# Patient Record
Sex: Female | Born: 1950 | Race: White | Hispanic: No | Marital: Married | Smoking: Former smoker
Health system: Southern US, Community
[De-identification: ages and names within clinical notes are randomized; demographics above are authoritative.]

## PROBLEM LIST (undated history)

## (undated) DIAGNOSIS — M1990 Unspecified osteoarthritis, unspecified site: Secondary | ICD-10-CM

## (undated) DIAGNOSIS — I10 Essential (primary) hypertension: Secondary | ICD-10-CM

## (undated) DIAGNOSIS — I341 Nonrheumatic mitral (valve) prolapse: Secondary | ICD-10-CM

## (undated) DIAGNOSIS — N189 Chronic kidney disease, unspecified: Secondary | ICD-10-CM

---

## 1999-07-23 HISTORY — PX: ROTATOR CUFF REPAIR: SHX139

## 2004-08-03 ENCOUNTER — Ambulatory Visit: Payer: Self-pay | Admitting: Unknown Physician Specialty

## 2005-10-18 ENCOUNTER — Ambulatory Visit: Payer: Self-pay | Admitting: Unknown Physician Specialty

## 2006-09-03 ENCOUNTER — Ambulatory Visit: Payer: Self-pay | Admitting: Unknown Physician Specialty

## 2006-11-12 ENCOUNTER — Ambulatory Visit: Payer: Self-pay | Admitting: Unknown Physician Specialty

## 2007-12-15 ENCOUNTER — Encounter: Admission: RE | Admit: 2007-12-15 | Discharge: 2007-12-15 | Payer: Self-pay | Admitting: Unknown Physician Specialty

## 2009-01-02 ENCOUNTER — Ambulatory Visit: Payer: Self-pay | Admitting: Unknown Physician Specialty

## 2010-01-15 ENCOUNTER — Ambulatory Visit: Payer: Self-pay | Admitting: Unknown Physician Specialty

## 2011-01-21 ENCOUNTER — Ambulatory Visit: Payer: Self-pay | Admitting: Unknown Physician Specialty

## 2011-10-16 ENCOUNTER — Ambulatory Visit: Payer: Self-pay | Admitting: Unknown Physician Specialty

## 2011-10-21 LAB — PATHOLOGY REPORT

## 2012-01-31 ENCOUNTER — Ambulatory Visit: Payer: Self-pay | Admitting: Internal Medicine

## 2013-02-08 ENCOUNTER — Ambulatory Visit: Payer: Self-pay | Admitting: Physician Assistant

## 2014-02-09 ENCOUNTER — Ambulatory Visit: Payer: Self-pay | Admitting: Physician Assistant

## 2015-01-25 ENCOUNTER — Other Ambulatory Visit: Payer: Self-pay | Admitting: Physician Assistant

## 2015-01-25 DIAGNOSIS — Z1231 Encounter for screening mammogram for malignant neoplasm of breast: Secondary | ICD-10-CM

## 2015-02-13 ENCOUNTER — Ambulatory Visit: Payer: Self-pay

## 2015-02-14 ENCOUNTER — Ambulatory Visit: Payer: Self-pay

## 2015-02-17 ENCOUNTER — Ambulatory Visit
Admission: RE | Admit: 2015-02-17 | Discharge: 2015-02-17 | Disposition: A | Discharge status: Home / Self Care | Payer: Medicare Other | Source: Ambulatory Visit | Attending: Physician Assistant | Admitting: Physician Assistant

## 2015-02-17 ENCOUNTER — Other Ambulatory Visit: Payer: Self-pay | Admitting: Physician Assistant

## 2015-02-17 DIAGNOSIS — Z1231 Encounter for screening mammogram for malignant neoplasm of breast: Secondary | ICD-10-CM

## 2015-10-17 ENCOUNTER — Other Ambulatory Visit: Payer: Self-pay | Admitting: Physician Assistant

## 2015-10-17 DIAGNOSIS — Z1231 Encounter for screening mammogram for malignant neoplasm of breast: Secondary | ICD-10-CM

## 2016-02-19 ENCOUNTER — Ambulatory Visit
Admission: RE | Admit: 2016-02-19 | Discharge: 2016-02-19 | Disposition: A | Discharge status: Home / Self Care | Payer: Medicare Other | Source: Ambulatory Visit | Attending: Physician Assistant | Admitting: Physician Assistant

## 2016-02-19 ENCOUNTER — Other Ambulatory Visit: Payer: Self-pay | Admitting: Physician Assistant

## 2016-02-19 DIAGNOSIS — Z1231 Encounter for screening mammogram for malignant neoplasm of breast: Secondary | ICD-10-CM | POA: Diagnosis present

## 2017-01-30 ENCOUNTER — Other Ambulatory Visit: Payer: Self-pay | Admitting: Physician Assistant

## 2017-01-30 DIAGNOSIS — Z1231 Encounter for screening mammogram for malignant neoplasm of breast: Secondary | ICD-10-CM

## 2017-02-24 ENCOUNTER — Ambulatory Visit
Admission: RE | Admit: 2017-02-24 | Discharge: 2017-02-24 | Disposition: A | Discharge status: Home / Self Care | Payer: Medicare Other | Source: Ambulatory Visit | Attending: Physician Assistant | Admitting: Physician Assistant

## 2017-02-24 DIAGNOSIS — Z1231 Encounter for screening mammogram for malignant neoplasm of breast: Secondary | ICD-10-CM | POA: Diagnosis not present

## 2017-03-11 ENCOUNTER — Encounter: Payer: Self-pay | Admitting: *Deleted

## 2017-03-12 ENCOUNTER — Ambulatory Visit
Admission: RE | Admit: 2017-03-12 | Discharge: 2017-03-12 | Disposition: A | Discharge status: Home / Self Care | Payer: Medicare Other | Source: Ambulatory Visit | Attending: Unknown Physician Specialty | Admitting: Unknown Physician Specialty

## 2017-03-12 ENCOUNTER — Ambulatory Visit: Payer: Medicare Other | Admitting: Anesthesiology

## 2017-03-12 ENCOUNTER — Encounter
Admission: RE | Disposition: A | Discharge status: Home / Self Care | Payer: Self-pay | Source: Ambulatory Visit | Attending: Unknown Physician Specialty

## 2017-03-12 ENCOUNTER — Encounter: Payer: Self-pay | Admitting: Anesthesiology

## 2017-03-12 DIAGNOSIS — I129 Hypertensive chronic kidney disease with stage 1 through stage 4 chronic kidney disease, or unspecified chronic kidney disease: Secondary | ICD-10-CM | POA: Insufficient documentation

## 2017-03-12 DIAGNOSIS — Z7982 Long term (current) use of aspirin: Secondary | ICD-10-CM | POA: Diagnosis not present

## 2017-03-12 DIAGNOSIS — I341 Nonrheumatic mitral (valve) prolapse: Secondary | ICD-10-CM | POA: Diagnosis not present

## 2017-03-12 DIAGNOSIS — Z881 Allergy status to other antibiotic agents status: Secondary | ICD-10-CM | POA: Insufficient documentation

## 2017-03-12 DIAGNOSIS — Z1211 Encounter for screening for malignant neoplasm of colon: Secondary | ICD-10-CM | POA: Insufficient documentation

## 2017-03-12 DIAGNOSIS — G4730 Sleep apnea, unspecified: Secondary | ICD-10-CM | POA: Insufficient documentation

## 2017-03-12 DIAGNOSIS — Z79899 Other long term (current) drug therapy: Secondary | ICD-10-CM | POA: Diagnosis not present

## 2017-03-12 DIAGNOSIS — K640 First degree hemorrhoids: Secondary | ICD-10-CM | POA: Insufficient documentation

## 2017-03-12 DIAGNOSIS — Z87891 Personal history of nicotine dependence: Secondary | ICD-10-CM | POA: Diagnosis not present

## 2017-03-12 DIAGNOSIS — N189 Chronic kidney disease, unspecified: Secondary | ICD-10-CM | POA: Diagnosis not present

## 2017-03-12 DIAGNOSIS — J449 Chronic obstructive pulmonary disease, unspecified: Secondary | ICD-10-CM | POA: Insufficient documentation

## 2017-03-12 DIAGNOSIS — M1990 Unspecified osteoarthritis, unspecified site: Secondary | ICD-10-CM | POA: Insufficient documentation

## 2017-03-12 DIAGNOSIS — D124 Benign neoplasm of descending colon: Secondary | ICD-10-CM | POA: Insufficient documentation

## 2017-03-12 HISTORY — PX: COLONOSCOPY WITH PROPOFOL: SHX5780

## 2017-03-12 HISTORY — DX: Chronic kidney disease, unspecified: N18.9

## 2017-03-12 HISTORY — DX: Unspecified osteoarthritis, unspecified site: M19.90

## 2017-03-12 HISTORY — DX: Essential (primary) hypertension: I10

## 2017-03-12 HISTORY — DX: Nonrheumatic mitral (valve) prolapse: I34.1

## 2017-03-12 SURGERY — COLONOSCOPY WITH PROPOFOL
Anesthesia: General

## 2017-03-12 MED ORDER — SODIUM CHLORIDE 0.9 % IV SOLN: INTRAVENOUS | Status: DC

## 2017-03-12 MED ORDER — PROPOFOL 500 MG/50ML IV EMUL
INTRAVENOUS | Status: AC
  Filled 2017-03-12: qty 50

## 2017-03-12 MED ORDER — FENTANYL CITRATE (PF) 100 MCG/2ML IJ SOLN
INTRAMUSCULAR | Status: DC | PRN
  Administered 2017-03-12 (×2): 50 ug via INTRAVENOUS

## 2017-03-12 MED ORDER — SODIUM CHLORIDE 0.9 % IV SOLN
INTRAVENOUS | Status: DC
  Administered 2017-03-12: 11:00:00 via INTRAVENOUS

## 2017-03-12 MED ORDER — EPHEDRINE SULFATE 50 MG/ML IJ SOLN
INTRAMUSCULAR | Status: DC | PRN
  Administered 2017-03-12: 10 mg via INTRAVENOUS

## 2017-03-12 MED ORDER — MIDAZOLAM HCL 2 MG/2ML IJ SOLN
INTRAMUSCULAR | Status: DC | PRN
  Administered 2017-03-12: 2 mg via INTRAVENOUS

## 2017-03-12 MED ORDER — EPHEDRINE SULFATE 50 MG/ML IJ SOLN
INTRAMUSCULAR | Status: AC
  Filled 2017-03-12: qty 1

## 2017-03-12 MED ORDER — PROPOFOL 500 MG/50ML IV EMUL
INTRAVENOUS | Status: DC | PRN
  Administered 2017-03-12: 120 ug/kg/min via INTRAVENOUS

## 2017-03-12 MED ORDER — MIDAZOLAM HCL 2 MG/2ML IJ SOLN
INTRAMUSCULAR | Status: AC
  Filled 2017-03-12: qty 2

## 2017-03-12 MED ORDER — FENTANYL CITRATE (PF) 100 MCG/2ML IJ SOLN
INTRAMUSCULAR | Status: AC
  Filled 2017-03-12: qty 2

## 2017-03-12 NOTE — H&P (Signed)
Primary Care Physician:  Marinda Elk, MD Primary Gastroenterologist:  Dr. Vira Agar  Pre-Procedure History & Physical: HPI:  Annette Bell is a 66 y.o. female is here for an colonoscopy.   Past Medical History:  Diagnosis Date  . Arthritis   . Chronic kidney disease   . Hypertension   . Mitral valve prolapse     Past Surgical History:  Procedure Laterality Date  . ROTATOR CUFF REPAIR Left 2001    Prior to Admission medications   Medication Sig Start Date End Date Taking? Authorizing Provider  aspirin EC 81 MG tablet Take 81 mg by mouth daily.   Yes [provider]  atorvastatin (LIPITOR) 10 MG tablet Take 10 mg by mouth daily.   Yes [provider]  CALCIUM CARBONATE-VIT D-MIN PO Take 1 tablet by mouth daily.   Yes [provider]  co-enzyme Q-10 30 MG capsule Take 30 mg by mouth daily.   Yes [provider]  diazepam (VALIUM) 5 MG tablet Take 5 mg by mouth daily.   Yes [provider]  fexofenadine-pseudoephedrine (ALLEGRA-D 24) 180-240 MG 24 hr tablet Take 1 tablet by mouth daily.   Yes [provider]  irbesartan (AVAPRO) 150 MG tablet Take 150 mg by mouth daily.   Yes [provider]  Multiple Vitamin (MULTIVITAMIN) tablet Take 1 tablet by mouth daily.   Yes [provider]  omega-3 acid ethyl esters (LOVAZA) 1 g capsule Take 1 g by mouth daily.   Yes [provider]  triamterene-hydrochlorothiazide (MAXZIDE-25) 37.5-25 MG tablet Take 1 tablet by mouth daily.   Yes [provider]  cetirizine (ZYRTEC) 10 MG tablet Take 10 mg by mouth daily.    [provider]    Allergies as of 12/13/2016 - never reviewed  Allergen Reaction Noted  . Sulfa antibiotics Other (See Comments) 02/17/2015    Family History  Problem Relation Age of Onset  . Breast cancer Neg Hx     Social History   Social History  . Marital status: Married    Spouse name: N/A  . Number of  children: N/A  . Years of education: N/A   Occupational History  . Not on file.   Social History Main Topics  . Smoking status: Former Research scientist (life sciences)  . Smokeless tobacco: Never Used  . Alcohol use Yes  . Drug use: No  . Sexual activity: Not on file   Other Topics Concern  . Not on file   Social History Narrative  . No narrative on file    Review of Systems: See HPI, otherwise negative ROS  Physical Exam: BP (!) 132/56   Pulse 81   Temp 99.6 F (37.6 C) (Tympanic)   Resp 16   Ht 5\' 7"  (1.702 m)   Wt 76.2 kg (168 lb)   SpO2 100%   BMI 26.31 kg/m  General:   Alert,  pleasant and cooperative in NAD Head:  Normocephalic and atraumatic. Neck:  Supple; no masses or thyromegaly. Lungs:  Clear throughout to auscultation.    Heart:  Regular rate and rhythm. Abdomen:  Soft, nontender and nondistended. Normal bowel sounds, without guarding, and without rebound.   Neurologic:  Alert and  oriented x4;  grossly normal neurologically.  Impression/Plan: Annette Bell is here for an colonoscopy to be performed for [facility] colon polyps  Risks, benefits, limitations, and alternatives regarding  colonoscopy have been reviewed with the patient.  Questions have been answered.  All parties agreeable.  Gaylyn Cheers, MD  03/12/2017, 11:19 AM

## 2017-03-12 NOTE — Transfer of Care (Signed)
Immediate Anesthesia Transfer of Care Note  Patient: Annette Bell  Procedure(s) Performed: Procedure(s): COLONOSCOPY WITH PROPOFOL (N/A)  Patient Location: PACU  Anesthesia Type:General  Level of Consciousness: awake and sedated  Airway & Oxygen Therapy: Patient Spontanous Breathing and Patient connected to nasal cannula oxygen  Post-op Assessment: Report given to RN and Post -op Vital signs reviewed and stable  Post vital signs: Reviewed  Last Vitals:  Vitals:   03/12/17 1104  BP: (!) 132/56  Pulse: 81  Resp: 16  Temp: 37.6 C  SpO2: 100%    Last Pain:  Vitals:   03/12/17 1104  TempSrc: Tympanic         Complications: No apparent anesthesia complications

## 2017-03-12 NOTE — Anesthesia Post-op Follow-up Note (Signed)
Anesthesia QCDR form completed.        

## 2017-03-12 NOTE — Anesthesia Procedure Notes (Signed)
Performed by: Vaughan Sine Pre-anesthesia Checklist: Patient identified, Suction available, Emergency Drugs available, Patient being monitored and Timeout performed Patient Re-evaluated:Patient Re-evaluated prior to induction Oxygen Delivery Method: Nasal cannula Preoxygenation: Pre-oxygenation with 100% oxygen Induction Type: IV induction Placement Confirmation: positive ETCO2 and CO2 detector

## 2017-03-12 NOTE — Anesthesia Postprocedure Evaluation (Signed)
Anesthesia Post Note  Patient: Annette Bell  Procedure(s) Performed: Procedure(s) (LRB): COLONOSCOPY WITH PROPOFOL (N/A)  Patient location during evaluation: Endoscopy Anesthesia Type: General Level of consciousness: awake and alert and oriented Pain management: pain level controlled Vital Signs Assessment: post-procedure vital signs reviewed and stable Respiratory status: spontaneous breathing, nonlabored ventilation and respiratory function stable Cardiovascular status: blood pressure returned to baseline and stable Postop Assessment: no signs of nausea or vomiting Anesthetic complications: no     Last Vitals:  Vitals:   03/12/17 1157 03/12/17 1207  BP: (!) 107/46 (!) 112/55  Pulse: 68 82  Resp: 16 16  Temp: (!) 35.6 C   SpO2: 99% 100%    Last Pain:  Vitals:   03/12/17 1157  TempSrc: Tympanic                 Mehtab Dolberry

## 2017-03-12 NOTE — Op Note (Signed)
[facility] Gastroenterology Patient Name: Annette Bell Procedure Date: 03/12/2017 11:23 AM MRN: [id number] Account #: [id number] Date of Birth: [birth year] Admit Type: Outpatient Age: 66 Room: [facility] ENDO ROOM 3 Gender: Female Note Status: Finalized Procedure:            Colonoscopy Indications:          High risk colon cancer surveillance: Personal history                        of colonic polyps Providers:            Manya Silvas, MD Referring MD:         Precious Bard, MD (Referring MD) Medicines:            Propofol per Anesthesia Complications:        No immediate complications. Procedure:            Pre-Anesthesia Assessment:                       - After reviewing the risks and benefits, the patient                        was deemed in satisfactory condition to undergo the                        procedure.                       After obtaining informed consent, the colonoscope was                        passed under direct vision. Throughout the procedure,                        the patient's blood pressure, pulse, and oxygen                        saturations were monitored continuously. The                        Colonoscope was introduced through the anus and                        advanced to the the cecum, identified by appendiceal                        orifice and ileocecal valve. The colonoscopy was                        performed without difficulty. The patient tolerated the                        procedure well. The quality of the bowel preparation                        was excellent. Findings:      Two sessile polyps were found in the transverse colon. The polyps were       diminutive in size. These polyps were removed with a jumbo cold forceps.       Resection and retrieval were complete.      A small  polyp was found in the descending colon. The polyp was sessile.       The polyp was removed with a hot snare. Resection and  retrieval were       complete.      The exam was otherwise without abnormality.      Internal hemorrhoids were found during endoscopy. The hemorrhoids were       small and Grade I (internal hemorrhoids that do not prolapse).      The exam was otherwise without abnormality. Impression:           - Two diminutive polyps in the transverse colon,                        removed with a jumbo cold forceps. Resected and                        retrieved.                       - One small polyp in the descending colon, removed with                        a hot snare. Resected and retrieved.                       - The examination was otherwise normal. Recommendation:       - Await pathology results. Manya Silvas, MD 03/12/2017 11:56:10 AM This report has been signed electronically. Number of Addenda: 0 Note Initiated On: 03/12/2017 11:23 AM Scope Withdrawal Time: 0 hours 13 minutes 57 seconds  Total Procedure Duration: 0 hours 21 minutes 41 seconds       [facility]

## 2017-03-12 NOTE — Anesthesia Preprocedure Evaluation (Signed)
Anesthesia Evaluation  Patient identified by MRN, date of birth, ID band Patient awake    Reviewed: Allergy & Precautions, NPO status , Patient's Chart, lab work & pertinent test results  History of Anesthesia Complications Negative for: history of anesthetic complications  Airway Mallampati: III  TM Distance: >3 FB Neck ROM: Full    Dental no notable dental hx.    Pulmonary neg sleep apnea, neg COPD, former smoker,    breath sounds clear to auscultation- rhonchi (-) wheezing      Cardiovascular Exercise Tolerance: Good hypertension, Pt. on medications (-) CAD, (-) Past MI and (-) Cardiac Stents  Rhythm:Regular Rate:Normal - Systolic murmurs and - Diastolic murmurs    Neuro/Psych negative neurological ROS  negative psych ROS   GI/Hepatic negative GI ROS, Neg liver ROS,   Endo/Other  negative endocrine ROSneg diabetes  Renal/GU Renal InsufficiencyRenal disease     Musculoskeletal  (+) Arthritis ,   Abdominal (+) - obese,   Peds  Hematology negative hematology ROS (+)   Anesthesia Other Findings Past Medical History: No date: Arthritis No date: Chronic kidney disease No date: Hypertension No date: Mitral valve prolapse   Reproductive/Obstetrics                             Anesthesia Physical Anesthesia Plan  ASA: II  Anesthesia Plan: General   Post-op Pain Management:    Induction: Intravenous  PONV Risk Score and Plan: 2 and Propofol infusion  Airway Management Planned: Natural Airway  Additional Equipment:   Intra-op Plan:   Post-operative Plan:   Informed Consent: I have reviewed the patients History and Physical, chart, labs and discussed the procedure including the risks, benefits and alternatives for the proposed anesthesia with the patient or authorized representative who has indicated his/her understanding and acceptance.   Dental advisory given  Plan Discussed  with: CRNA and Anesthesiologist  Anesthesia Plan Comments:         Anesthesia Quick Evaluation

## 2017-03-13 ENCOUNTER — Encounter: Payer: Self-pay | Admitting: Unknown Physician Specialty

## 2017-03-13 LAB — SURGICAL PATHOLOGY

## 2018-02-27 ENCOUNTER — Other Ambulatory Visit: Payer: Self-pay | Admitting: Physician Assistant

## 2018-03-02 ENCOUNTER — Other Ambulatory Visit: Payer: Self-pay | Admitting: Physician Assistant

## 2018-03-02 DIAGNOSIS — Z1231 Encounter for screening mammogram for malignant neoplasm of breast: Secondary | ICD-10-CM

## 2018-03-11 ENCOUNTER — Other Ambulatory Visit: Payer: Self-pay | Admitting: Sports Medicine

## 2018-03-11 DIAGNOSIS — G8929 Other chronic pain: Secondary | ICD-10-CM

## 2018-03-11 DIAGNOSIS — M25511 Pain in right shoulder: Principal | ICD-10-CM

## 2018-03-19 ENCOUNTER — Ambulatory Visit
Admission: RE | Admit: 2018-03-19 | Discharge: 2018-03-19 | Disposition: A | Discharge status: Home / Self Care | Payer: Medicare Other | Source: Ambulatory Visit | Attending: Sports Medicine | Admitting: Sports Medicine

## 2018-03-19 ENCOUNTER — Other Ambulatory Visit: Payer: Self-pay | Admitting: Sports Medicine

## 2018-03-19 ENCOUNTER — Ambulatory Visit
Admission: RE | Admit: 2018-03-19 | Discharge: 2018-03-19 | Disposition: A | Discharge status: Home / Self Care | Payer: Self-pay | Source: Ambulatory Visit | Attending: Sports Medicine | Admitting: Sports Medicine

## 2018-03-19 DIAGNOSIS — M25511 Pain in right shoulder: Principal | ICD-10-CM

## 2018-03-19 DIAGNOSIS — G8929 Other chronic pain: Secondary | ICD-10-CM

## 2018-03-19 MED ORDER — IOPAMIDOL (ISOVUE-M 200) INJECTION 41%
12.0000 mL | Freq: Once | INTRAMUSCULAR | Status: AC
  Administered 2018-03-19: 12 mL via INTRA_ARTICULAR

## 2018-03-20 ENCOUNTER — Ambulatory Visit
Admission: RE | Admit: 2018-03-20 | Discharge: 2018-03-20 | Disposition: A | Discharge status: Home / Self Care | Payer: Medicare Other | Source: Ambulatory Visit | Attending: Physician Assistant | Admitting: Physician Assistant

## 2018-03-20 DIAGNOSIS — Z1231 Encounter for screening mammogram for malignant neoplasm of breast: Secondary | ICD-10-CM | POA: Insufficient documentation

## 2018-08-26 ENCOUNTER — Other Ambulatory Visit: Payer: Self-pay | Admitting: Neurology

## 2018-08-26 DIAGNOSIS — R55 Syncope and collapse: Secondary | ICD-10-CM

## 2018-09-08 ENCOUNTER — Other Ambulatory Visit: Payer: Medicare Other

## 2019-01-04 ENCOUNTER — Other Ambulatory Visit: Payer: Self-pay | Admitting: Physician Assistant

## 2019-01-04 DIAGNOSIS — Z1231 Encounter for screening mammogram for malignant neoplasm of breast: Secondary | ICD-10-CM

## 2019-04-14 ENCOUNTER — Ambulatory Visit
Admission: RE | Admit: 2019-04-14 | Discharge: 2019-04-14 | Disposition: A | Discharge status: Home / Self Care | Payer: Medicare Other | Source: Ambulatory Visit | Attending: Physician Assistant | Admitting: Physician Assistant

## 2019-04-14 DIAGNOSIS — Z1231 Encounter for screening mammogram for malignant neoplasm of breast: Secondary | ICD-10-CM | POA: Insufficient documentation

## 2020-04-10 ENCOUNTER — Other Ambulatory Visit: Payer: Self-pay | Admitting: Physician Assistant

## 2020-04-10 DIAGNOSIS — Z1231 Encounter for screening mammogram for malignant neoplasm of breast: Secondary | ICD-10-CM

## 2020-04-11 ENCOUNTER — Other Ambulatory Visit: Payer: Self-pay | Admitting: Nephrology

## 2020-04-11 DIAGNOSIS — N1831 Chronic kidney disease, stage 3a: Secondary | ICD-10-CM

## 2020-04-19 ENCOUNTER — Ambulatory Visit: Payer: Medicare Other

## 2020-05-09 ENCOUNTER — Ambulatory Visit
Admission: RE | Admit: 2020-05-09 | Discharge: 2020-05-09 | Disposition: A | Discharge status: Home / Self Care | Payer: Medicare Other | Source: Ambulatory Visit | Attending: Physician Assistant | Admitting: Physician Assistant

## 2020-05-09 ENCOUNTER — Other Ambulatory Visit: Payer: Self-pay

## 2020-05-09 DIAGNOSIS — Z1231 Encounter for screening mammogram for malignant neoplasm of breast: Secondary | ICD-10-CM | POA: Diagnosis not present

## 2020-05-11 ENCOUNTER — Ambulatory Visit: Payer: Medicare Other

## 2020-05-12 ENCOUNTER — Ambulatory Visit: Payer: Medicare Other

## 2020-05-18 ENCOUNTER — Ambulatory Visit
Admission: RE | Admit: 2020-05-18 | Discharge: 2020-05-18 | Disposition: A | Discharge status: Home / Self Care | Payer: Medicare Other | Source: Ambulatory Visit | Attending: Nephrology | Admitting: Nephrology

## 2020-05-18 ENCOUNTER — Other Ambulatory Visit: Payer: Self-pay

## 2020-05-18 DIAGNOSIS — N1831 Chronic kidney disease, stage 3a: Secondary | ICD-10-CM

## 2020-06-19 ENCOUNTER — Other Ambulatory Visit: Payer: Self-pay | Admitting: Nephrology

## 2020-06-19 DIAGNOSIS — N2889 Other specified disorders of kidney and ureter: Secondary | ICD-10-CM

## 2020-07-03 ENCOUNTER — Ambulatory Visit: Payer: Medicare Other

## 2021-02-14 ENCOUNTER — Other Ambulatory Visit: Payer: Self-pay | Admitting: Physician Assistant

## 2021-02-14 DIAGNOSIS — Z1231 Encounter for screening mammogram for malignant neoplasm of breast: Secondary | ICD-10-CM

## 2021-03-20 ENCOUNTER — Other Ambulatory Visit: Payer: Self-pay | Admitting: Nephrology

## 2021-03-20 DIAGNOSIS — N2889 Other specified disorders of kidney and ureter: Secondary | ICD-10-CM

## 2021-04-12 ENCOUNTER — Other Ambulatory Visit: Payer: Medicare Other

## 2021-04-13 ENCOUNTER — Other Ambulatory Visit: Payer: Self-pay

## 2021-04-13 ENCOUNTER — Ambulatory Visit
Admission: RE | Admit: 2021-04-13 | Discharge: 2021-04-13 | Disposition: A | Discharge status: Home / Self Care | Payer: Medicare Other | Source: Ambulatory Visit | Attending: Nephrology | Admitting: Nephrology

## 2021-04-13 DIAGNOSIS — N2889 Other specified disorders of kidney and ureter: Secondary | ICD-10-CM

## 2021-06-11 ENCOUNTER — Other Ambulatory Visit: Payer: Self-pay

## 2021-06-11 ENCOUNTER — Ambulatory Visit
Admission: RE | Admit: 2021-06-11 | Discharge: 2021-06-11 | Disposition: A | Discharge status: Home / Self Care | Payer: Medicare Other | Source: Ambulatory Visit | Attending: Physician Assistant | Admitting: Physician Assistant

## 2021-06-11 DIAGNOSIS — Z1231 Encounter for screening mammogram for malignant neoplasm of breast: Secondary | ICD-10-CM | POA: Insufficient documentation

## 2022-06-03 ENCOUNTER — Other Ambulatory Visit: Payer: Self-pay | Admitting: Physician Assistant

## 2022-06-03 DIAGNOSIS — Z1231 Encounter for screening mammogram for malignant neoplasm of breast: Secondary | ICD-10-CM

## 2022-07-24 ENCOUNTER — Ambulatory Visit
Admission: RE | Admit: 2022-07-24 | Discharge: 2022-07-24 | Disposition: A | Discharge status: Home / Self Care | Payer: Medicare Other | Source: Ambulatory Visit | Attending: Physician Assistant | Admitting: Physician Assistant

## 2022-07-24 DIAGNOSIS — Z1231 Encounter for screening mammogram for malignant neoplasm of breast: Secondary | ICD-10-CM | POA: Diagnosis present

## 2023-02-17 ENCOUNTER — Encounter: Payer: Self-pay | Admitting: Gastroenterology

## 2023-02-21 ENCOUNTER — Encounter: Payer: Self-pay | Admitting: Gastroenterology

## 2023-02-24 ENCOUNTER — Ambulatory Visit
Admission: RE | Admit: 2023-02-24 | Discharge: 2023-02-24 | Disposition: A | Discharge status: Home / Self Care | Payer: Medicare Other | Source: Ambulatory Visit | Attending: Gastroenterology | Admitting: Gastroenterology

## 2023-02-24 ENCOUNTER — Ambulatory Visit: Payer: Medicare Other | Admitting: Anesthesiology

## 2023-02-24 ENCOUNTER — Encounter
Admission: RE | Disposition: A | Discharge status: Home / Self Care | Payer: Self-pay | Source: Ambulatory Visit | Attending: Gastroenterology

## 2023-02-24 ENCOUNTER — Encounter: Payer: Self-pay | Admitting: Gastroenterology

## 2023-02-24 DIAGNOSIS — Z83719 Family history of colon polyps, unspecified: Secondary | ICD-10-CM | POA: Insufficient documentation

## 2023-02-24 DIAGNOSIS — I341 Nonrheumatic mitral (valve) prolapse: Secondary | ICD-10-CM | POA: Diagnosis not present

## 2023-02-24 DIAGNOSIS — Z87891 Personal history of nicotine dependence: Secondary | ICD-10-CM | POA: Insufficient documentation

## 2023-02-24 DIAGNOSIS — N183 Chronic kidney disease, stage 3 unspecified: Secondary | ICD-10-CM | POA: Diagnosis not present

## 2023-02-24 DIAGNOSIS — D128 Benign neoplasm of rectum: Secondary | ICD-10-CM | POA: Insufficient documentation

## 2023-02-24 DIAGNOSIS — Z1211 Encounter for screening for malignant neoplasm of colon: Secondary | ICD-10-CM | POA: Diagnosis present

## 2023-02-24 DIAGNOSIS — D123 Benign neoplasm of transverse colon: Secondary | ICD-10-CM | POA: Insufficient documentation

## 2023-02-24 DIAGNOSIS — I129 Hypertensive chronic kidney disease with stage 1 through stage 4 chronic kidney disease, or unspecified chronic kidney disease: Secondary | ICD-10-CM | POA: Diagnosis not present

## 2023-02-24 HISTORY — PX: COLONOSCOPY: SHX5424

## 2023-02-24 SURGERY — COLONOSCOPY
Anesthesia: General

## 2023-02-24 MED ORDER — LIDOCAINE HCL (PF) 2 % IJ SOLN
INTRAMUSCULAR | Status: AC
  Filled 2023-02-24: qty 5

## 2023-02-24 MED ORDER — PROPOFOL 500 MG/50ML IV EMUL
INTRAVENOUS | Status: DC | PRN
  Administered 2023-02-24: 75 ug/kg/min via INTRAVENOUS

## 2023-02-24 MED ORDER — PROPOFOL 1000 MG/100ML IV EMUL
INTRAVENOUS | Status: AC
  Filled 2023-02-24: qty 100

## 2023-02-24 MED ORDER — PROPOFOL 10 MG/ML IV BOLUS
INTRAVENOUS | Status: DC | PRN
Start: 2023-02-24 — End: 2023-02-24
  Administered 2023-02-24: 50 mg via INTRAVENOUS
  Administered 2023-02-24: 20 mg via INTRAVENOUS

## 2023-02-24 MED ORDER — LIDOCAINE HCL (CARDIAC) PF 100 MG/5ML IV SOSY
PREFILLED_SYRINGE | INTRAVENOUS | Status: DC | PRN
  Administered 2023-02-24: 60 mg via INTRAVENOUS

## 2023-02-24 MED ORDER — SODIUM CHLORIDE 0.9 % IV SOLN: INTRAVENOUS | Status: DC

## 2023-02-24 NOTE — H&P (Signed)
Pre-Procedure H&P   Patient ID: Annette [name] is a 72 y.o. female.  Gastroenterology Provider: Jaynie Collins, DO  Referring Provider: Fransico Setters, NP PCP: Patrice Paradise, MD  Date: 02/24/2023  HPI Annette [name] is a 72 y.o. female who presents today for Colonoscopy for Surveillance-personal history of colon polyps .  Last underwent colonoscopy in August 2018 with 1 adenomatous polyp.  Father with history of colon polyps.  Patient denies all GI symptoms including constipation diarrhea hematochezia and melena  Previous colonoscopies have been unremarkable  Hemoglobin 12.5 MCV 92 platelets 289,000   Past Medical History:  Diagnosis Date   Arthritis    Chronic kidney disease    Hypertension    Mitral valve prolapse     Past Surgical History:  Procedure Laterality Date   COLONOSCOPY WITH PROPOFOL N/A 03/12/2017   Procedure: COLONOSCOPY WITH PROPOFOL;  Surgeon: Scot Jun, MD;  Location: [facility] ENDOSCOPY;  Service: Endoscopy;  Laterality: N/A;   ROTATOR CUFF REPAIR Left 2001    Family History Father- colon polyps No h/o GI disease or malignancy  Review of Systems  Constitutional:  Negative for activity change, appetite change, chills, diaphoresis, fatigue, fever and unexpected weight change.  HENT:  Negative for trouble swallowing and voice change.   Respiratory:  Negative for shortness of breath and wheezing.   Cardiovascular:  Negative for chest pain, palpitations and leg swelling.  Gastrointestinal:  Negative for abdominal distention, abdominal pain, anal bleeding, blood in stool, constipation, diarrhea, nausea, rectal pain and vomiting.  Musculoskeletal:  Negative for arthralgias and myalgias.  Skin:  Negative for color change and pallor.  Neurological:  Negative for dizziness, syncope and weakness.  Psychiatric/Behavioral:  Negative for confusion.   All other systems reviewed and are negative.    Medications No current  facility-administered medications on file prior to encounter.   Current Outpatient Medications on File Prior to Encounter  Medication Sig Dispense Refill   irbesartan (AVAPRO) 150 MG tablet Take 150 mg by mouth daily.     aspirin EC 81 MG tablet Take 81 mg by mouth daily.     atorvastatin (LIPITOR) 10 MG tablet Take 10 mg by mouth daily.     CALCIUM CARBONATE-VIT D-MIN PO Take 1 tablet by mouth daily.     cetirizine (ZYRTEC) 10 MG tablet Take 10 mg by mouth daily.     co-enzyme Q-10 30 MG capsule Take 30 mg by mouth daily.     diazepam (VALIUM) 5 MG tablet Take 5 mg by mouth daily.     fexofenadine-pseudoephedrine (ALLEGRA-D 24) 180-240 MG 24 hr tablet Take 1 tablet by mouth daily.     Multiple Vitamin (MULTIVITAMIN) tablet Take 1 tablet by mouth daily.     omega-3 acid ethyl esters (LOVAZA) 1 g capsule Take 1 g by mouth daily.     triamterene-hydrochlorothiazide (MAXZIDE-25) 37.5-25 MG tablet Take 1 tablet by mouth daily.      Pertinent medications related to GI and procedure were reviewed by me with the patient prior to the procedure   Current Facility-Administered Medications:    0.9 %  sodium chloride infusion, , Intravenous, Continuous, Jaynie Collins, DO  sodium chloride         Allergies  Allergen Reactions   Sulfa Antibiotics Other (See Comments)    headach   Allergies were reviewed by me prior to the procedure  Objective   Body mass index is 26.09 kg/m. Vitals:   02/24/23 1311  BP: Marland Kitchen)  174/63  Pulse: 71  Resp: 18  Temp: (!) 96.6 F (35.9 C)  TempSrc: Temporal  SpO2: 100%  Weight: 75.6 kg  Height: 5\' 7"  (1.702 m)     Physical Exam Vitals and nursing note reviewed.  Constitutional:      General: She is not in acute distress.    Appearance: Normal appearance. She is not ill-appearing, toxic-appearing or diaphoretic.  HENT:     Head: Normocephalic and atraumatic.     Nose: Nose normal.     Mouth/Throat:     Mouth: Mucous membranes are moist.      Pharynx: Oropharynx is clear.  Eyes:     General: No scleral icterus.    Extraocular Movements: Extraocular movements intact.  Cardiovascular:     Rate and Rhythm: Normal rate and regular rhythm.     Heart sounds: Normal heart sounds. No murmur heard.    No friction rub. No gallop.  Pulmonary:     Effort: Pulmonary effort is normal. No respiratory distress.     Breath sounds: Normal breath sounds. No wheezing, rhonchi or rales.  Abdominal:     General: Bowel sounds are normal. There is no distension.     Palpations: Abdomen is soft.     Tenderness: There is no abdominal tenderness. There is no guarding or rebound.  Musculoskeletal:     Cervical back: Neck supple.     Right lower leg: No edema.     Left lower leg: No edema.  Skin:    General: Skin is warm and dry.     Coloration: Skin is not jaundiced or pale.  Neurological:     General: No focal deficit present.     Mental Status: She is alert and oriented to person, place, and time. Mental status is at baseline.  Psychiatric:        Mood and Affect: Mood normal.        Behavior: Behavior normal.        Thought Content: Thought content normal.        Judgment: Judgment normal.      Assessment:  Annette [name] is a 72 y.o. female  who presents today for Colonoscopy for Surveillance-personal history of colon polyps .  Plan:  Colonoscopy with possible intervention today  Colonoscopy with possible biopsy, control of bleeding, polypectomy, and interventions as necessary has been discussed with the patient/patient representative. Informed consent was obtained from the patient/patient representative after explaining the indication, nature, and risks of the procedure including but not limited to death, bleeding, perforation, missed neoplasm/lesions, cardiorespiratory compromise, and reaction to medications. Opportunity for questions was given and appropriate answers were provided. Patient/patient representative has verbalized  understanding is amenable to undergoing the procedure.   Jaynie Collins, DO  [facility] Gastroenterology  Portions of the record may have been created with voice recognition software. Occasional wrong-word or 'sound-a-like' substitutions may have occurred due to the inherent limitations of voice recognition software.  Read the chart carefully and recognize, using context, where substitutions may have occurred.

## 2023-02-24 NOTE — Anesthesia Postprocedure Evaluation (Signed)
Anesthesia Post Note  Patient: Annette [name]  Procedure(s) Performed: COLONOSCOPY  Patient location during evaluation: PACU Anesthesia Type: General Level of consciousness: awake and alert, oriented and patient cooperative Pain management: pain level controlled Vital Signs Assessment: post-procedure vital signs reviewed and stable Respiratory status: spontaneous breathing, nonlabored ventilation and respiratory function stable Cardiovascular status: [name] pressure returned to baseline and stable Postop Assessment: adequate PO intake Anesthetic complications: no   No notable events documented.   Last Vitals:  Vitals:   02/24/23 1459 02/24/23 1509  BP: 131/63   Pulse: 62 68  Resp: 20   Temp: 36.8 C   SpO2: 100% 100%    Last Pain:  Vitals:   02/24/23 1509  TempSrc:   PainSc: 0-No pain                 Reed Breech

## 2023-02-24 NOTE — Op Note (Signed)
[facility] Patient Name: Annette [name] Procedure Date: 02/24/2023 2:23 PM MRN: [id number] Account #: [id number] Date of Birth: [birth year] Admit Type: Outpatient Age: 72 Room: [facility] ENDO ROOM 2 Gender: Female Note Status: Finalized Instrument Name: Colonoscope [id number] Procedure:             Colonoscopy Indications:           High risk colon cancer surveillance: Personal history                         of colonic polyps Providers:             Jaynie Collins DO, DO Referring MD:          Marilynne Halsted, MD (Referring MD) Medicines:             Monitored Anesthesia Care Complications:         No immediate complications. Estimated blood loss:                         Minimal. Procedure:             Pre-Anesthesia Assessment:                        - Prior to the procedure, a History and Physical was                         performed, and patient medications and allergies were                         reviewed. The patient is competent. The risks and                         benefits of the procedure and the sedation options and                         risks were discussed with the patient. All questions                         were answered and informed consent was obtained.                         Patient identification and proposed procedure were                         verified by the physician, the nurse, the anesthetist                         and the technician in the endoscopy suite. Mental                         Status Examination: alert and oriented. Airway                         Examination: normal oropharyngeal airway and neck                         mobility. Respiratory Examination: clear to  auscultation. CV Examination: RRR, no murmurs, no S3                         or S4. Prophylactic Antibiotics: The patient does not                         require prophylactic antibiotics. Prior                          Anticoagulants: The patient has taken no anticoagulant                         or antiplatelet agents. ASA Grade Assessment: II - A                         patient with mild systemic disease. After reviewing                         the risks and benefits, the patient was deemed in                         satisfactory condition to undergo the procedure. The                         anesthesia plan was to use monitored anesthesia care                         (MAC). Immediately prior to administration of                         medications, the patient was re-assessed for adequacy                         to receive sedatives. The heart rate, respiratory                         rate, oxygen saturations, blood pressure, adequacy of                         pulmonary ventilation, and response to care were                         monitored throughout the procedure. The physical                         status of the patient was re-assessed after the                         procedure.                        After obtaining informed consent, the colonoscope was                         passed under direct vision. Throughout the procedure,                         the patient's blood pressure, pulse, and oxygen  saturations were monitored continuously. The                         Colonoscope was introduced through the anus and                         advanced to the the cecum, identified by appendiceal                         orifice and ileocecal valve. The colonoscopy was                         performed without difficulty. The patient tolerated                         the procedure well. The quality of the bowel                         preparation was evaluated using the BBPS [facility] Bowel                         Preparation Scale) with scores of: Right Colon = 2                         (minor amount of residual staining, small fragments of                         stool and/or  opaque liquid, but mucosa seen well),                         Transverse Colon = 3 (entire mucosa seen well with no                         residual staining, small fragments of stool or opaque                         liquid) and Left Colon = 3 (entire mucosa seen well                         with no residual staining, small fragments of stool or                         opaque liquid). The total BBPS score equals 8. The                         quality of the bowel preparation was excellent. The                         ileocecal valve, appendiceal orifice, and rectum were                         photographed. Findings:      The perianal and digital rectal examinations were normal. Pertinent       negatives include normal sphincter tone.      Five sessile polyps were found in the rectum (4) and transverse colon       (1). The polyps were 1 to  2 mm in size. These polyps were removed with a       jumbo cold forceps. Resection and retrieval were complete. Estimated       blood loss was minimal.      A 4 to 5 mm polyp was found in the transverse colon. The polyp was       sessile. The polyp was removed with a cold snare. Resection and       retrieval were complete. Estimated blood loss was minimal.      The exam was otherwise without abnormality on direct and retroflexion       views. Impression:            - Five 1 to 2 mm polyps in the rectum and in the                         transverse colon, removed with a jumbo cold forceps.                         Resected and retrieved.                        - One 4 to 5 mm polyp in the transverse colon, removed                         with a cold snare. Resected and retrieved.                        - The examination was otherwise normal on direct and                         retroflexion views. Recommendation:        - Patient has a contact number available for                         emergencies. The signs and symptoms of potential                          delayed complications were discussed with the patient.                         Return to normal activities tomorrow. Written                         discharge instructions were provided to the patient.                        - Discharge patient to home.                        - Resume previous diet.                        - Continue present medications.                        - No ibuprofen, naproxen, or other non-steroidal                         anti-inflammatory drugs for 5 days after polyp removal.                        -  Await pathology results.                        - Repeat colonoscopy for surveillance based on                         pathology results.                        - Return to referring physician as previously                         scheduled.                        - The findings and recommendations were discussed with                         the patient. Procedure Code(s):     --- Professional ---                        [phone], Colonoscopy, flexible; with removal of                         tumor(s), polyp(s), or other lesion(s) by snare                         technique                        [id number], 59, Colonoscopy, flexible; with biopsy, single                         or multiple Diagnosis Code(s):     --- Professional ---                        Z86.010, Personal history of colonic polyps                        D12.8, Benign neoplasm of rectum                        D12.3, Benign neoplasm of transverse colon (hepatic                         flexure or splenic flexure) CPT copyright 2022 [employer]. All rights reserved. The codes documented in this report are preliminary and upon coder review may  be revised to meet current compliance requirements. Attending Participation:      I personally performed the entire procedure. Elfredia Nevins, DO Jaynie Collins DO, DO 02/24/2023 2:59:24 PM This report has been signed electronically. Number  of Addenda: 0 Note Initiated On: 02/24/2023 2:23 PM Scope Withdrawal Time: 0 hours 12 minutes 43 seconds  Total Procedure Duration: 0 hours 21 minutes 46 seconds  Estimated Blood Loss:  Estimated blood loss was minimal.      [facility]

## 2023-02-24 NOTE — Anesthesia Preprocedure Evaluation (Signed)
Anesthesia Evaluation  Patient identified by MRN, date of birth, ID band Patient awake    Reviewed: Allergy & Precautions, NPO status , Patient's Chart, lab work & pertinent test results  History of Anesthesia Complications Negative for: history of anesthetic complications  Airway Mallampati: III   Neck ROM: Full    Dental no notable dental hx.    Pulmonary former smoker (quit greater than 20 years ago)   Pulmonary exam normal breath sounds clear to auscultation       Cardiovascular hypertension, Normal cardiovascular exam+ Valvular Problems/Murmurs MVP  Rhythm:Regular Rate:Normal     Neuro/Psych negative neurological ROS     GI/Hepatic negative GI ROS,,,  Endo/Other  negative endocrine ROS    Renal/GU Renal disease (stage III CKD)     Musculoskeletal  (+) Arthritis ,    Abdominal   Peds  Hematology negative hematology ROS (+)   Anesthesia Other Findings   Reproductive/Obstetrics                             Anesthesia Physical Anesthesia Plan  ASA: 2  Anesthesia Plan: General   Post-op Pain Management:    Induction: Intravenous  PONV Risk Score and Plan: 3 and Propofol infusion, TIVA and Treatment may vary due to age or medical condition  Airway Management Planned: Natural Airway  Additional Equipment:   Intra-op Plan:   Post-operative Plan:   Informed Consent: I have reviewed the patients History and Physical, chart, labs and discussed the procedure including the risks, benefits and alternatives for the proposed anesthesia with the patient or authorized representative who has indicated his/her understanding and acceptance.       Plan Discussed with: CRNA  Anesthesia Plan Comments: (LMA/GETA backup discussed.  Patient consented for risks of anesthesia including but not limited to:  - adverse reactions to medications - damage to eyes, teeth, lips or other oral  mucosa - nerve damage due to positioning  - sore throat or hoarseness - damage to heart, brain, nerves, lungs, other parts of body or loss of life  Informed patient about role of CRNA in peri- and intra-operative care.  Patient voiced understanding.)       Anesthesia Quick Evaluation

## 2023-02-24 NOTE — Transfer of Care (Signed)
Immediate Anesthesia Transfer of Care Note  Patient: Annette [name]  Procedure(s) Performed: COLONOSCOPY  Patient Location: PACU  Anesthesia Type:General  Level of Consciousness: awake, oriented, and patient cooperative  Airway & Oxygen Therapy: Patient Spontanous Breathing  Post-op Assessment: Report given to RN and Post -op Vital signs reviewed and stable  Post vital signs: Reviewed and stable  Last Vitals:  Vitals Value Taken Time  BP 131/63 02/24/23 1501  Temp    Pulse 63 02/24/23 1501  Resp 21 02/24/23 1501  SpO2 100 % 02/24/23 1501  Vitals shown include unfiled device data.  Last Pain:  Vitals:   02/24/23 1311  TempSrc: Temporal  PainSc: 0-No pain         Complications: No notable events documented.

## 2023-02-24 NOTE — Interval H&P Note (Signed)
History and Physical Interval Note: Preprocedure H&P from 02/24/23  was reviewed and there was no interval change after seeing and examining the patient.  Written consent was obtained from the patient after discussion of risks, benefits, and alternatives. 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  The various methods of treatment have been discussed with the patient and family. After consideration of risks, benefits and other options for treatment, the patient has consented to  Procedure(s): COLONOSCOPY (N/A) as a surgical intervention.  The patient's history has been reviewed, patient examined, no change in status, stable for surgery.  I have reviewed the patient's chart and labs.  Questions were answered to the patient's satisfaction.     Jaynie Collins

## 2023-02-25 ENCOUNTER — Encounter: Payer: Self-pay | Admitting: Gastroenterology

## 2023-05-29 IMAGING — MG MM DIGITAL SCREENING BILAT W/ TOMO AND CAD
6 of 10 series · 6 of 30 positions shown · non-contrast
Comparison: Previous exam(s).

CLINICAL DATA: Screening.

EXAM:
DIGITAL SCREENING BILATERAL MAMMOGRAM WITH TOMOSYNTHESIS AND CAD
TECHNIQUE: Bilateral screening digital craniocaudal and mediolateral oblique
mammograms were obtained. Bilateral screening digital breast
tomosynthesis was performed. The images were evaluated with
computer-aided detection.

[L CC synth-2D]
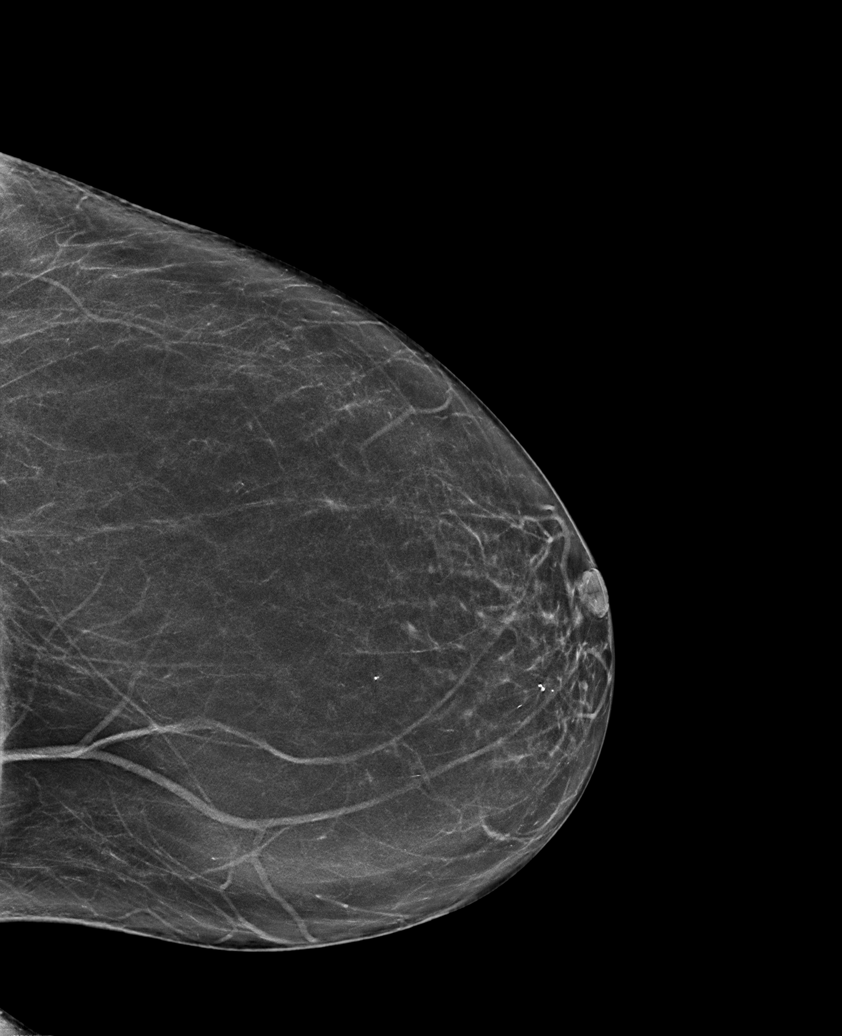

[L MLO synth-2D (1 of 2)]
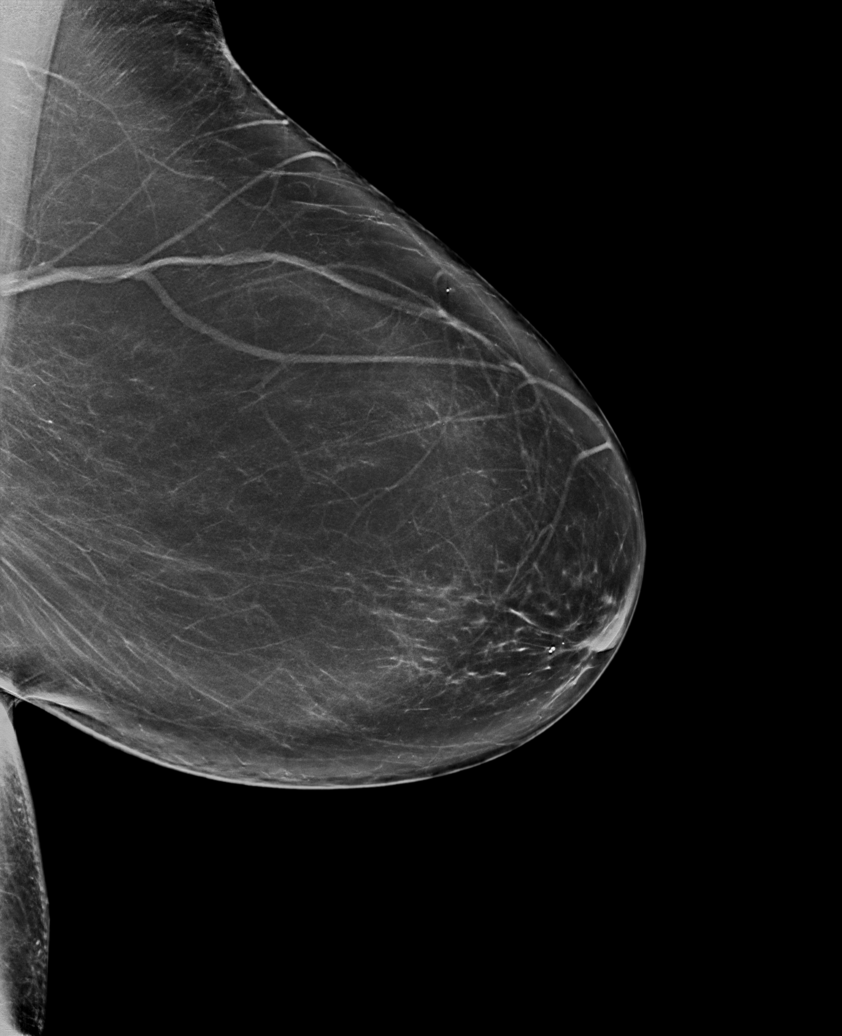

[R CC synth-2D]
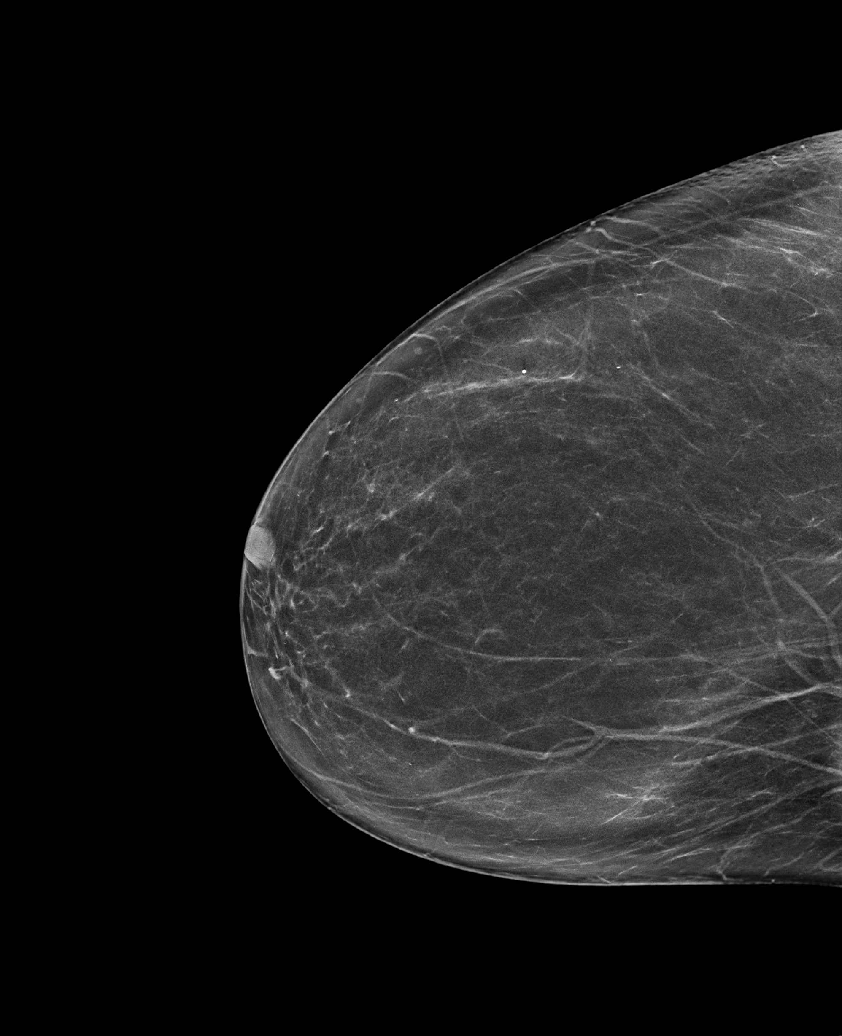

[R MLO synth-2D]
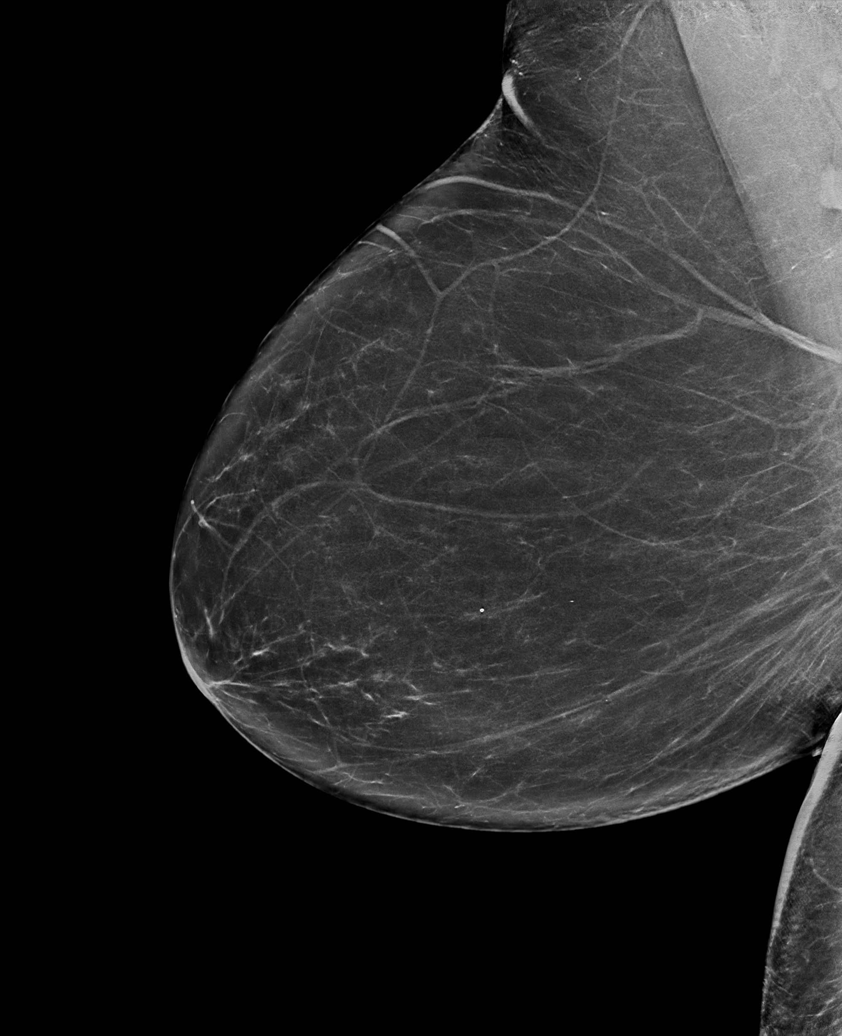

[L MLO synth-2D (2 of 2)]
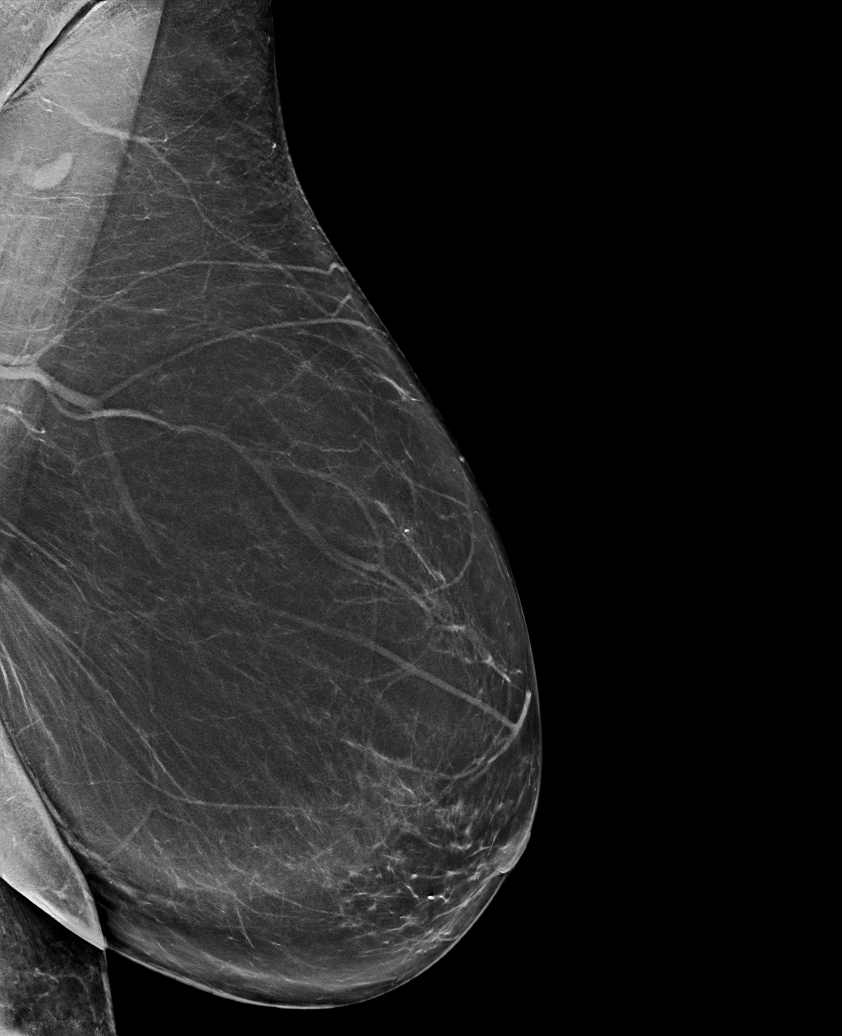

[L CC tomo · tomo slice 33/65.0]
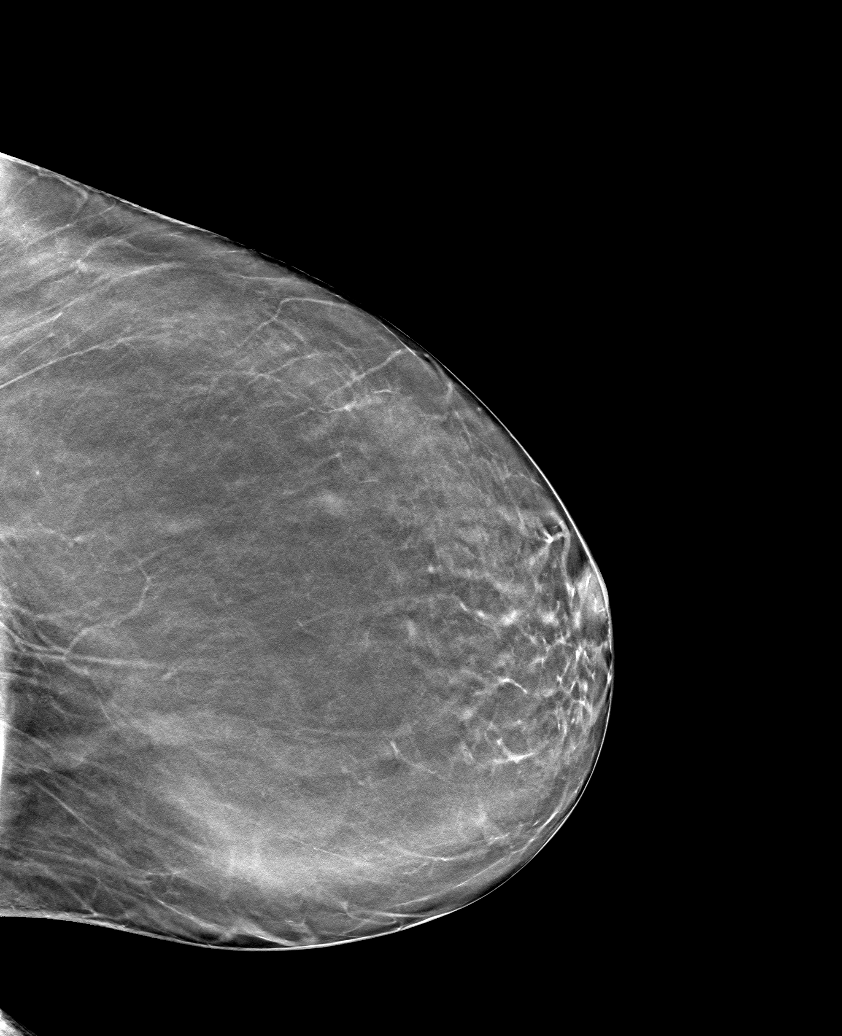

[6 of 30 positions shown; findings below may reference images not displayed]

ACR Breast Density Category b: There are scattered areas of
fibroglandular density.
FINDINGS: There are no findings suspicious for malignancy.
IMPRESSION: No mammographic evidence of malignancy. A result letter of this
screening mammogram will be mailed directly to the patient.

RECOMMENDATION:
Screening mammogram in one year. (Code:51-O-LD2)

BI-RADS CATEGORY  1: Negative.

## 2023-09-16 ENCOUNTER — Other Ambulatory Visit: Payer: Self-pay | Admitting: Physician Assistant

## 2023-09-16 DIAGNOSIS — Z1231 Encounter for screening mammogram for malignant neoplasm of breast: Secondary | ICD-10-CM

## 2023-10-06 ENCOUNTER — Ambulatory Visit
Admission: RE | Admit: 2023-10-06 | Discharge: 2023-10-06 | Disposition: A | Discharge status: Home / Self Care | Source: Ambulatory Visit | Attending: Physician Assistant | Admitting: Physician Assistant

## 2023-10-06 DIAGNOSIS — Z1231 Encounter for screening mammogram for malignant neoplasm of breast: Secondary | ICD-10-CM | POA: Diagnosis present
